# Patient Record
Sex: Male | Born: 1980 | Race: Black or African American | Hispanic: No | Marital: Married | Smoking: Current every day smoker
Health system: Southern US, Community
[De-identification: ages and names within clinical notes are randomized; demographics above are authoritative.]

## PROBLEM LIST (undated history)

## (undated) DIAGNOSIS — K859 Acute pancreatitis without necrosis or infection, unspecified: Secondary | ICD-10-CM

## (undated) HISTORY — PX: FACIAL RECONSTRUCTION SURGERY: SHX631

## (undated) HISTORY — PX: SHOULDER SURGERY: SHX246

---

## 1998-05-06 ENCOUNTER — Encounter: Payer: Self-pay | Admitting: Emergency Medicine

## 1998-05-06 ENCOUNTER — Inpatient Hospital Stay (HOSPITAL_COMMUNITY): Admission: EM | Admit: 1998-05-06 | Discharge: 1998-05-07 | Payer: Self-pay | Admitting: Emergency Medicine

## 2000-09-24 ENCOUNTER — Emergency Department (HOSPITAL_COMMUNITY): Admission: EM | Admit: 2000-09-24 | Discharge: 2000-09-24 | Payer: Self-pay | Admitting: Emergency Medicine

## 2001-06-01 ENCOUNTER — Emergency Department (HOSPITAL_COMMUNITY): Admission: EM | Admit: 2001-06-01 | Discharge: 2001-06-02 | Payer: Self-pay | Admitting: Emergency Medicine

## 2005-09-13 ENCOUNTER — Emergency Department (HOSPITAL_COMMUNITY): Admission: EM | Admit: 2005-09-13 | Discharge: 2005-09-13 | Payer: Self-pay | Admitting: Emergency Medicine

## 2006-07-19 ENCOUNTER — Emergency Department (HOSPITAL_COMMUNITY): Admission: EM | Admit: 2006-07-19 | Discharge: 2006-07-19 | Payer: Self-pay | Admitting: Family Medicine

## 2007-03-07 ENCOUNTER — Emergency Department (HOSPITAL_COMMUNITY): Admission: EM | Admit: 2007-03-07 | Discharge: 2007-03-07 | Payer: Self-pay | Admitting: Emergency Medicine

## 2008-03-16 ENCOUNTER — Emergency Department (HOSPITAL_COMMUNITY): Admission: EM | Admit: 2008-03-16 | Discharge: 2008-03-16 | Payer: Self-pay | Admitting: Family Medicine

## 2008-05-06 ENCOUNTER — Emergency Department (HOSPITAL_COMMUNITY): Admission: EM | Admit: 2008-05-06 | Discharge: 2008-05-06 | Payer: Self-pay | Admitting: Family Medicine

## 2008-07-29 ENCOUNTER — Emergency Department (HOSPITAL_COMMUNITY): Admission: EM | Admit: 2008-07-29 | Discharge: 2008-07-29 | Payer: Self-pay | Admitting: Emergency Medicine

## 2008-08-01 ENCOUNTER — Encounter: Admission: RE | Admit: 2008-08-01 | Discharge: 2008-09-11 | Payer: Self-pay | Admitting: Orthopedic Surgery

## 2008-10-12 ENCOUNTER — Emergency Department (HOSPITAL_COMMUNITY): Admission: EM | Admit: 2008-10-12 | Discharge: 2008-10-12 | Payer: Self-pay | Admitting: Emergency Medicine

## 2009-02-23 ENCOUNTER — Emergency Department (HOSPITAL_COMMUNITY): Admission: EM | Admit: 2009-02-23 | Discharge: 2009-02-23 | Payer: Self-pay | Admitting: Emergency Medicine

## 2011-04-09 LAB — POCT RAPID STREP A: Streptococcus, Group A Screen (Direct): NEGATIVE

## 2011-04-09 LAB — POCT URINALYSIS DIP (DEVICE)
Operator id: 235561
Urobilinogen, UA: 1
pH: 7.5

## 2011-07-23 ENCOUNTER — Encounter (HOSPITAL_COMMUNITY): Payer: Self-pay

## 2011-07-23 ENCOUNTER — Emergency Department (INDEPENDENT_AMBULATORY_CARE_PROVIDER_SITE_OTHER)
Admission: EM | Admit: 2011-07-23 | Discharge: 2011-07-23 | Disposition: A | Discharge status: Home / Self Care | Payer: Self-pay | Source: Home / Self Care | Attending: Emergency Medicine | Admitting: Emergency Medicine

## 2011-07-23 DIAGNOSIS — K089 Disorder of teeth and supporting structures, unspecified: Secondary | ICD-10-CM

## 2011-07-23 DIAGNOSIS — K0889 Other specified disorders of teeth and supporting structures: Secondary | ICD-10-CM

## 2011-07-23 MED ORDER — LIDOCAINE VISCOUS 2 % MT SOLN: 10.0000 mL | OROMUCOSAL | Status: AC | PRN

## 2011-07-23 MED ORDER — CHLORHEXIDINE GLUCONATE 0.12 % MT SOLN: OROMUCOSAL | Status: AC

## 2011-07-23 MED ORDER — HYDROCODONE-ACETAMINOPHEN 5-325 MG PO TABS: ORAL_TABLET | ORAL | Status: AC

## 2011-07-23 MED ORDER — PENICILLIN V POTASSIUM 500 MG PO TABS: 500.0000 mg | ORAL_TABLET | Freq: Four times a day (QID) | ORAL | Status: AC

## 2011-07-23 MED ORDER — IBUPROFEN 600 MG PO TABS: 600.0000 mg | ORAL_TABLET | Freq: Four times a day (QID) | ORAL | Status: AC | PRN
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
## 2011-07-23 NOTE — ED Notes (Addendum)
C/o bad tooth; has used vicodin, so not having pain at present. My woman ain't going to give me any more  of her medication any more, and I ain't any insurance to get any antibiotics

## 2011-07-23 NOTE — Discharge Instructions (Signed)
RESOURCE GUIDE  Dental Problems  Look up DebtSupply.pl.asp for a schedule of the  Dental Association's free dental clinics called [address] [facility] [place]. They have clinics all around [place]. Get there early and be prepared to wait.   [facility] [address] [place], [place] [phone]  Patients with Medicaid: [facility] [phone]                                [phone] [address] Phone:  [phone]                                                  Phone:  [phone]  If unable to pay or uninsured, contact:  [facility] or [facility]. to become qualified for the adult dental clinic.

## 2011-11-18 DIAGNOSIS — K089 Disorder of teeth and supporting structures, unspecified: Secondary | ICD-10-CM | POA: Insufficient documentation

## 2011-11-19 ENCOUNTER — Encounter (HOSPITAL_COMMUNITY): Payer: Self-pay

## 2011-11-19 ENCOUNTER — Emergency Department (HOSPITAL_COMMUNITY)
Admission: EM | Admit: 2011-11-19 | Discharge: 2011-11-19 | Disposition: A | Discharge status: Home / Self Care | Payer: Self-pay | Attending: Emergency Medicine | Admitting: Emergency Medicine

## 2011-11-19 DIAGNOSIS — K0889 Other specified disorders of teeth and supporting structures: Secondary | ICD-10-CM

## 2011-11-19 MED ORDER — ACETAMINOPHEN-CODEINE #3 300-30 MG PO TABS: 1.0000 | ORAL_TABLET | Freq: Four times a day (QID) | ORAL | Status: AC | PRN

## 2011-11-19 MED ORDER — ACETAMINOPHEN 325 MG PO TABS
650.0000 mg | ORAL_TABLET | Freq: Once | ORAL | Status: AC
  Administered 2011-11-19: 650 mg via ORAL
  Filled 2011-11-19: qty 1

## 2011-11-19 NOTE — ED Provider Notes (Signed)
Medical screening examination/treatment/procedure(s) were performed by non-physician practitioner and as supervising physician I was immediately available for consultation/collaboration.   Loren Racer, MD 11/19/11 [phone]
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
## 2011-11-19 NOTE — Discharge Instructions (Signed)
You were seen and evaluated for your dental pains. Please followup with the dentist for continued evaluation and treatment of your symptoms. Return to the hospital for any worsening symptoms, swelling of the canals, difficulty swallowing, difficulty breathing, fever, chills, sweats.   Dental Pain A tooth ache may be caused by cavities (tooth decay). Cavities expose the nerve of the tooth to air and hot or cold temperatures. It may come from an infection or abscess (also called a boil or furuncle) around your tooth. It is also often caused by dental caries (tooth decay). This causes the pain you are having. DIAGNOSIS  Your caregiver can diagnose this problem by exam. TREATMENT   If caused by an infection, it may be treated with medications which kill germs (antibiotics) and pain medications as prescribed by your caregiver. Take medications as directed.   Only take over-the-counter or prescription medicines for pain, discomfort, or fever as directed by your caregiver.   Whether the tooth ache today is caused by infection or dental disease, you should see your dentist as soon as possible for further care.  SEEK MEDICAL CARE IF: The exam and treatment you received today has been provided on an emergency basis only. This is not a substitute for complete medical or dental care. If your problem worsens or new problems (symptoms) appear, and you are unable to meet with your dentist, call or return to this location. SEEK IMMEDIATE MEDICAL CARE IF:   You have a fever.   You develop redness and swelling of your face, jaw, or neck.   You are unable to open your mouth.   You have severe pain uncontrolled by pain medicine.  MAKE SURE YOU:   Understand these instructions.   Will watch your condition.   Will get help right away if you are not doing well or get worse.  Document Released: 06/14/2005 Document Revised: 06/03/2011 Document Reviewed: 01/31/2008 [facility] Patient Information 2012  [place], [place].    RESOURCE GUIDE  Dental Problems  Patients with Medicaid: [facility] [phone]                                           [phone] [address] [employer] Phone:  [phone]                                                  Phone:  [phone]  If unable to pay or uninsured, contact:  [facility] or [facility]. to become qualified for the adult dental clinic.  Chronic Pain Problems Contact [facility]  [phone] Patients need to be referred by their primary care doctor.  Insufficient Money for Medicine Contact [employer]:  call "211" or [facility] [phone].  No Primary Care Doctor Call [facility]  [phone] Other agencies that provide inexpensive medical care    [facility]  [phone]    [facility]  [phone]    [facility]  [phone]    [facility]  [phone]    [facility]  [phone]    [facility]  [phone]  Psychological Services [facility] Behavior[facility]  [phone] [facility]  [phone] [facility]   [phone] (emergency services 5168773769)  Substance Abuse Resources [facility]  [phone] [facility] 6186515337 [facility] [place] [phone] Floydene Flock [phone] Residential & Outpatient Substance Abuse Program  [phone]  Abuse/Neglect [facility] Child Abuse Hotline 469-698-4157 [facility] Child Abuse Hotline [phone] [phone] (After Hours)  Emergency Shelter [facility] [phone]  Maternity Homes [facility] [place] [facility] [phone] [facility] [phone]  MRSA Hotline #:   719-001-4998    [facility] Resources  [facility]     [employer]                          [facility] Dept. [address] [place]                        [address]      [address] [place] [address]  Blondell Reveal Phone:  [phone]                                   Phone:  [phone]                 Phone:  [phone]  [facility] Phone:  [phone]  [facility] Child Abuse Hotline [phone] (After Hours)

## 2011-11-19 NOTE — ED Notes (Signed)
Pt complains of a toothache that started about three hours ago

## 2011-12-03 ENCOUNTER — Emergency Department (INDEPENDENT_AMBULATORY_CARE_PROVIDER_SITE_OTHER)
Admission: EM | Admit: 2011-12-03 | Discharge: 2011-12-03 | Disposition: A | Discharge status: Home / Self Care | Payer: Self-pay | Source: Home / Self Care

## 2011-12-03 ENCOUNTER — Encounter (HOSPITAL_COMMUNITY): Payer: Self-pay | Admitting: Emergency Medicine

## 2011-12-03 DIAGNOSIS — K121 Other forms of stomatitis: Secondary | ICD-10-CM

## 2011-12-03 MED ORDER — PENICILLIN V POTASSIUM 500 MG PO TABS: 500.0000 mg | ORAL_TABLET | Freq: Three times a day (TID) | ORAL | Status: AC

## 2011-12-03 NOTE — Discharge Instructions (Signed)
Patient was instructed to return to work tomorrow- 12/04/2011    Stomatitis Stomatitis is an inflammation of the mucous lining of the mouth. It can affect part of the mouth or the whole mouth. The intensity of symptoms can range from mild to severe. It can affect your cheek, teeth, gums, lips, or tongue. In almost all cases, the lining of the mouth becomes swollen, red, and painful. Painful ulcers can develop in your mouth. Stomatitis recurs in some people. CAUSES  There are many common causes of stomatitis. They include:  Viruses (such as cold sores or shingles).   Canker sores.   Bacteria (such as ulcerative gingivitis or sexually transmitted diseases).   Fungus or yeast (such as candidiasis or oral thrush).   Poor oral hygiene and poor nutrition (Vincent's stomatitis or trench mouth).   Lack of vitamin B, vitamin C, or niacin.   Dentures or braces that do not fit properly.   High acid foods (uncommon).   Sharp or broken teeth.   Cheek biting.   Breathing through the mouth.   Chewing tobacco.   Allergy to toothpaste, mouthwash, candy, gum, lipstick, or some medicines.   Burning your mouth with hot drinks or food.   Exposure to dyes, heavy metals, acid fumes, or mineral dust.  SYMPTOMS   Painful ulcers in the mouth.   Blisters in the mouth.   Bleeding gums.   Swollen gums.   Irritability.   Bad breath.   Bad taste in the mouth.   Fever.   Trouble eating because of burning and pain in the mouth.  DIAGNOSIS  Your caregiver will examine your mouth and look for bleeding gums and mouth ulcers. Your caregiver may ask you about the medicines you are taking. Your caregiver may suggest a blood test and tissue sample (biopsy) of the mouth ulcer or mass if either is present. This will help find the cause of your condition. TREATMENT  Your treatment will depend on the cause of your condition. Your caregiver will first try to treat your symptoms.   You may be  given pain medicine. Topical anesthetic may be used to numb the area if you have severe pain.   Your caregiver may prescribe antibiotic medicine if you have a bacterial infection.   Your caregiver may prescribe antifungal medicine if you have a fungal infection.   You may need to take antiviral medicine if you have a viral infection like herpes.   You may be asked to use medicated mouth rinses.   Your caregiver will advise you about proper brushing and using a soft toothbrush. You also need to get your teeth cleaned regularly.  HOME CARE INSTRUCTIONS   Maintain good oral hygiene. This is especially important for transplant patients.   Brush your teeth carefully with a soft, nylon-bristled toothbrush.   Floss at least 2 times a day.   Clean your mouth after eating.   Rinse your mouth with salt water 3 to 4 times a day.   Gargle with cold water.   Use topical numbing medicines to decrease pain if recommended by your caregiver.   Stop smoking, and stop using chewing or smokeless tobacco.   Avoid eating hot and spicy foods.   Eat soft and bland food.   Reduce your stress wherever possible.   Eat healthy and nutritious foods.  SEEK MEDICAL CARE IF:   Your symptoms persist or get worse.   You develop new symptoms.   Your mouth ulcers are present for more  than 3 weeks.   Your mouth ulcers come back frequently.   You have increasing difficulty with normal eating and drinking.   You have increasing fatigue or weakness.   You develop loss of appetite or nausea.  SEEK IMMEDIATE MEDICAL CARE IF:   You have a fever.   You develop pain, redness, or sores around one or both eyes.   You cannot eat or drink because of pain or other symptoms.   You develop worsening weakness, or you faint.   You develop vomiting or diarrhea.   You develop chest pain, shortness of breath, or rapid and irregular heartbeats.  MAKE SURE YOU:  Understand these instructions.   Will watch  your condition.   Will get help right away if you are not doing well or get worse.  Document Released: 04/11/2007 Document Revised: 06/03/2011 Document Reviewed: 01/21/2011 [facility] Patient Information 2012 [place], [place].
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
## 2011-12-03 NOTE — ED Notes (Signed)
Pt here with c/o left uppper tooth abscess and pain that flared up Tuesday.denies drainage but sore to the touch.pt was seen here 11/19/11 for same sx.no dental f/u reported.vss.pt took percocet and aleve for pain

## 2012-01-07 ENCOUNTER — Encounter (HOSPITAL_COMMUNITY): Payer: Self-pay | Admitting: Emergency Medicine

## 2012-01-07 ENCOUNTER — Emergency Department (HOSPITAL_COMMUNITY)
Admission: EM | Admit: 2012-01-07 | Discharge: 2012-01-07 | Disposition: A | Discharge status: Home / Self Care | Payer: Self-pay | Attending: Emergency Medicine | Admitting: Emergency Medicine

## 2012-01-07 ENCOUNTER — Emergency Department (HOSPITAL_COMMUNITY): Payer: Self-pay

## 2012-01-07 DIAGNOSIS — R51 Headache: Secondary | ICD-10-CM | POA: Insufficient documentation

## 2012-01-07 DIAGNOSIS — F17200 Nicotine dependence, unspecified, uncomplicated: Secondary | ICD-10-CM | POA: Insufficient documentation

## 2012-01-07 DIAGNOSIS — K089 Disorder of teeth and supporting structures, unspecified: Secondary | ICD-10-CM | POA: Insufficient documentation

## 2012-01-07 DIAGNOSIS — K0889 Other specified disorders of teeth and supporting structures: Secondary | ICD-10-CM

## 2012-01-07 MED ORDER — KETOROLAC TROMETHAMINE 60 MG/2ML IM SOLN
60.0000 mg | Freq: Once | INTRAMUSCULAR | Status: AC
  Administered 2012-01-07: 60 mg via INTRAMUSCULAR
  Filled 2012-01-07: qty 2
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
## 2012-01-08 NOTE — ED Provider Notes (Signed)
Medical screening examination/treatment/procedure(s) were performed by non-physician practitioner and as supervising physician I was immediately available for consultation/collaboration.  Jacorion Klem, MD 01/08/12 2101 

## 2012-02-14 ENCOUNTER — Encounter (HOSPITAL_COMMUNITY): Payer: Self-pay | Admitting: *Deleted

## 2012-02-14 ENCOUNTER — Emergency Department (HOSPITAL_COMMUNITY)
Admission: EM | Admit: 2012-02-14 | Discharge: 2012-02-14 | Disposition: A | Discharge status: Home / Self Care | Payer: Self-pay | Attending: Emergency Medicine | Admitting: Emergency Medicine

## 2012-02-14 DIAGNOSIS — F17200 Nicotine dependence, unspecified, uncomplicated: Secondary | ICD-10-CM | POA: Insufficient documentation

## 2012-02-14 DIAGNOSIS — K859 Acute pancreatitis without necrosis or infection, unspecified: Secondary | ICD-10-CM | POA: Insufficient documentation

## 2012-02-14 DIAGNOSIS — K0889 Other specified disorders of teeth and supporting structures: Secondary | ICD-10-CM

## 2012-02-14 DIAGNOSIS — K029 Dental caries, unspecified: Secondary | ICD-10-CM | POA: Insufficient documentation

## 2012-02-14 HISTORY — DX: Acute pancreatitis without necrosis or infection, unspecified: K85.90

## 2012-02-14 MED ORDER — PENICILLIN V POTASSIUM 500 MG PO TABS: 500.0000 mg | ORAL_TABLET | Freq: Three times a day (TID) | ORAL | Status: AC

## 2012-02-14 MED ORDER — TRAMADOL HCL 50 MG PO TABS: 50.0000 mg | ORAL_TABLET | Freq: Four times a day (QID) | ORAL | Status: AC | PRN
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
## 2012-02-14 NOTE — ED Notes (Signed)
Pt c/o right upper jaw toothache x 2 days; states popped a "bubble" back there and it feels worse since then

## 2012-02-14 NOTE — ED Notes (Signed)
Pt sts pain x 1 week on the right side of his mouth. Pt sts he burst a bubble in the back right upper portion of his mouth yesterday and tastes something coming out of it. Pain has gotten worse since yesterday. Sts he has not been able to eat like normal and feels the pain shooting up his head when he lays down.

## 2012-02-14 NOTE — ED Notes (Signed)
Patient given discharge instructions, information, prescriptions, and diet order. Patient states that they adequately understand discharge information given and to return to ED if symptoms return or worsen.     

## 2012-02-14 NOTE — ED Provider Notes (Signed)
Medical screening examination/treatment/procedure(s) were performed by non-physician practitioner and as supervising physician I was immediately available for consultation/collaboration.  Olivia Mackie, MD 02/14/12 [phone]

## 2012-11-28 IMAGING — CT CT HEAD W/O CM
2 series · 17 of 30 positions shown, 20 images · non-contrast
Comparison: None.

CLINICAL DATA: Left-sided headache and facial pain.

CT HEAD WITHOUT CONTRAST
TECHNIQUE: Contiguous axial images were obtained from the base of
the skull through the vertex without contrast.

[Series 2: head w/o · axial · non-contrast · 0.43mm/px · z∈[+1421,+1536]mm · 9 of 29 slices shown, 12 images]
[im 3/29  brain]
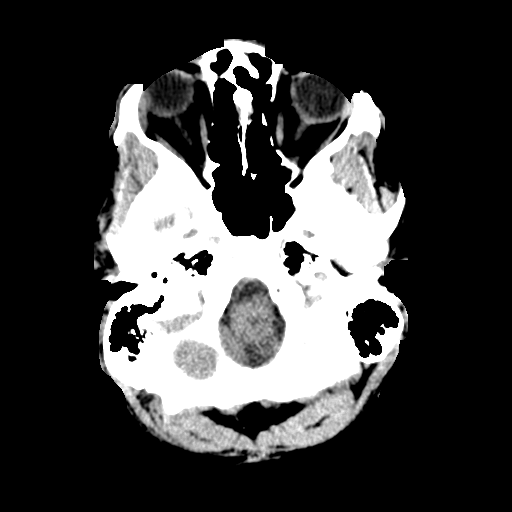
[im 3/29  bone]
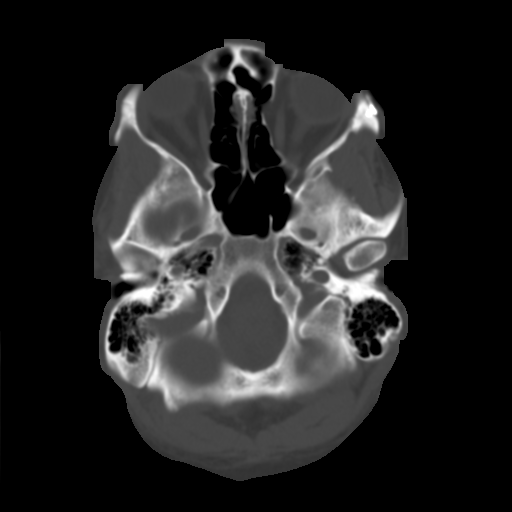
[im 6/29  brain]
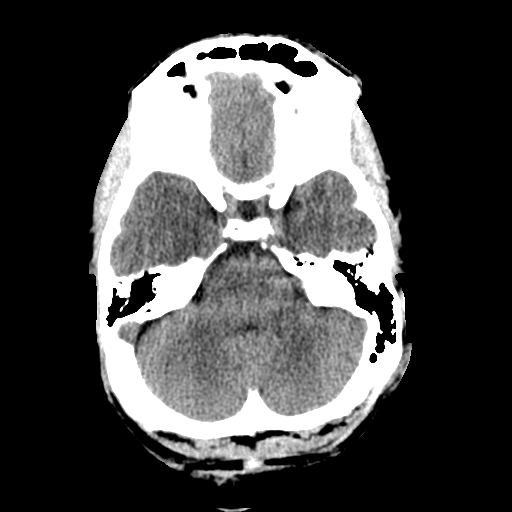
[im 9/29  brain]
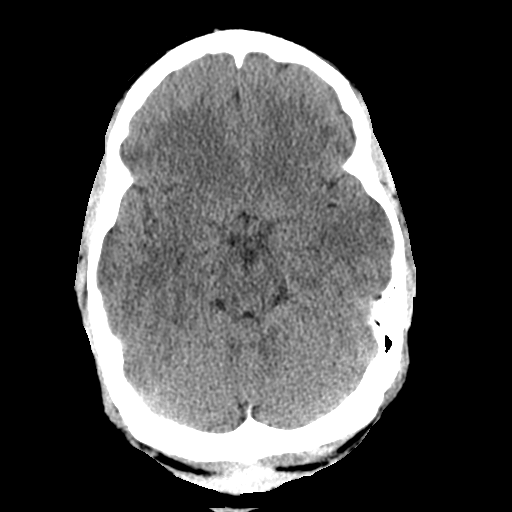
[im 12/29  brain]
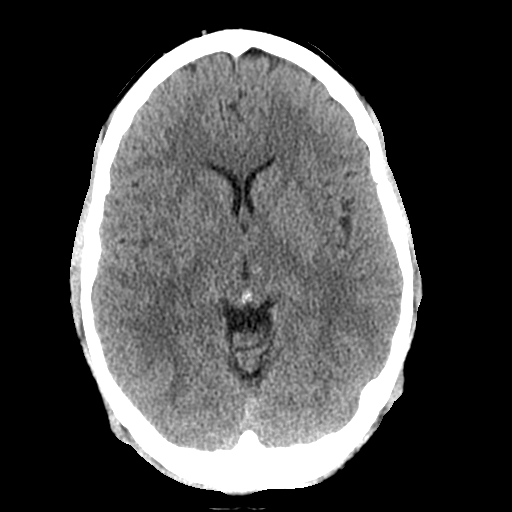
[im 15/29  brain]
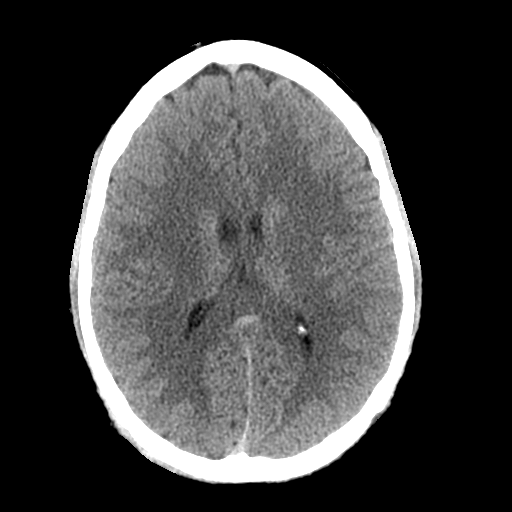
[im 15/29  bone]
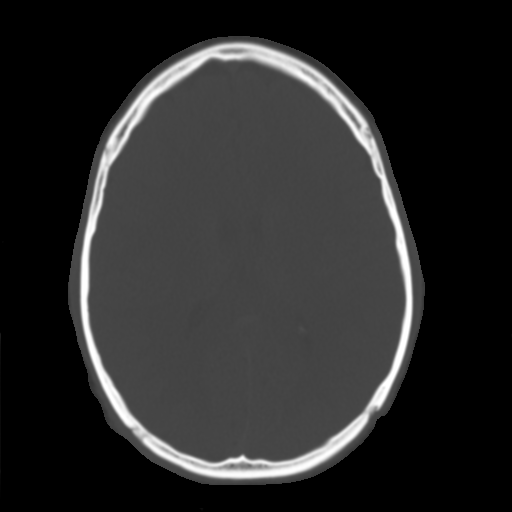
[im 17/29  brain]
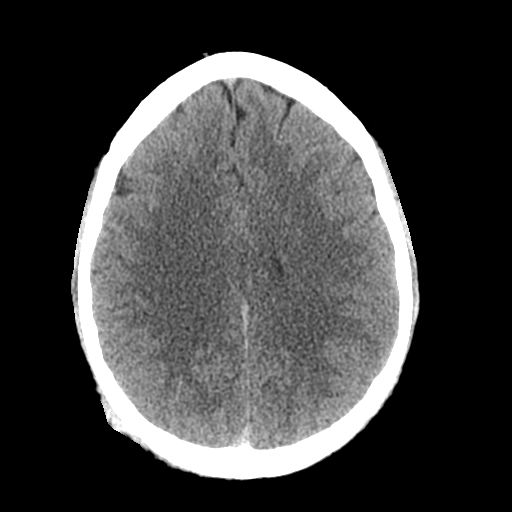
[im 20/29  brain]
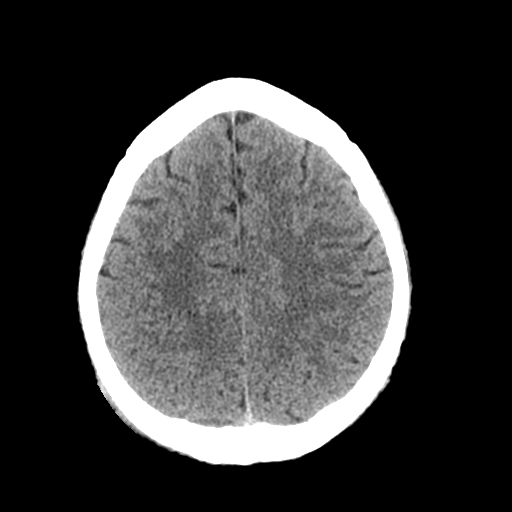
[im 23/29  brain]
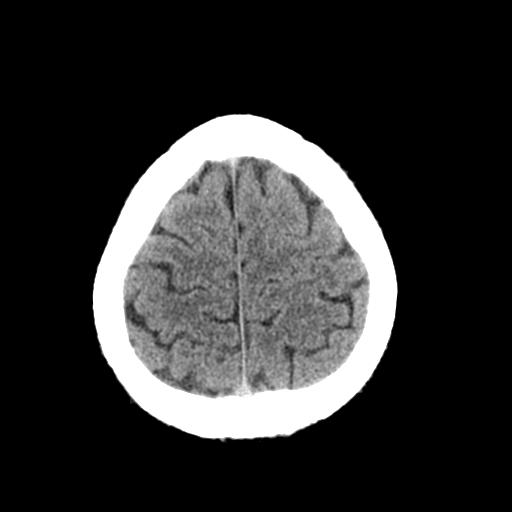
[im 26/29  brain]
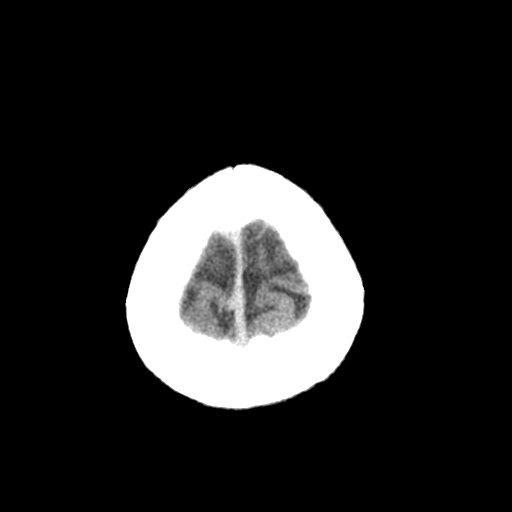
[im 26/29  bone]
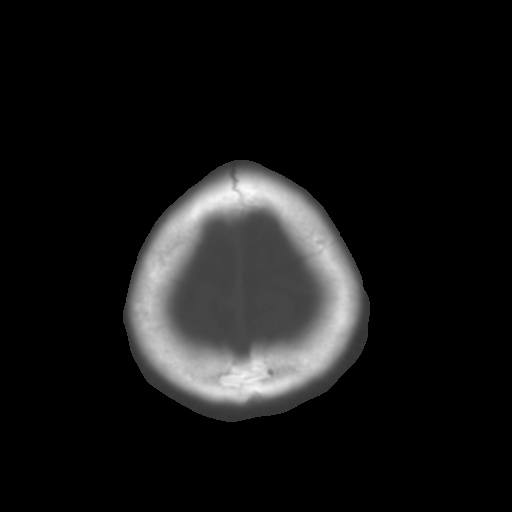

[Series 3: bone windows · axial · 0.43mm/px · z∈[+1426,+1534]mm · 8 of 48 slices shown]
[im 6/48  bone]
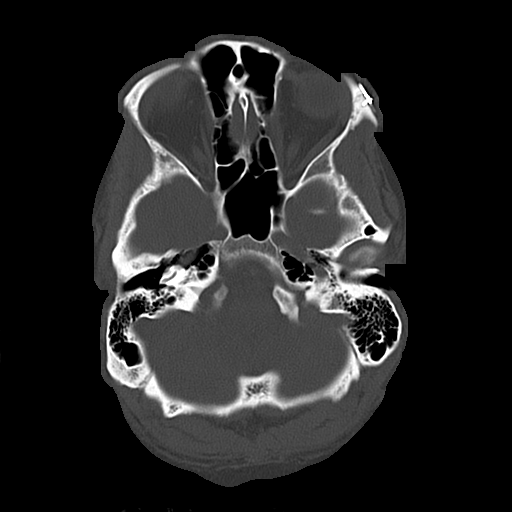
[im 11/48  bone]
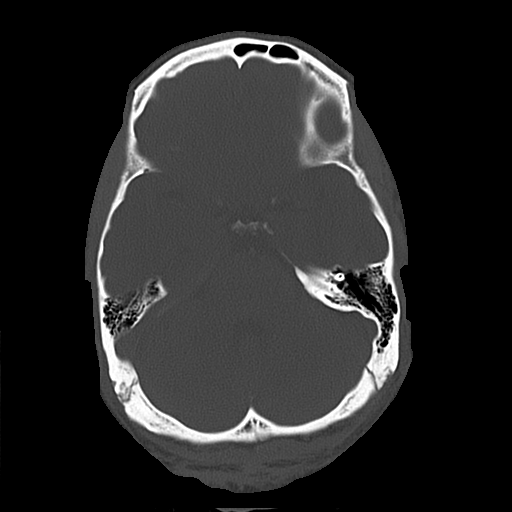
[im 16/48  bone]
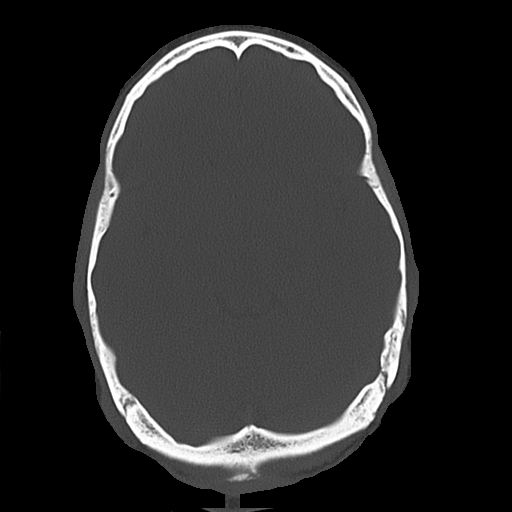
[im 21/48  bone]
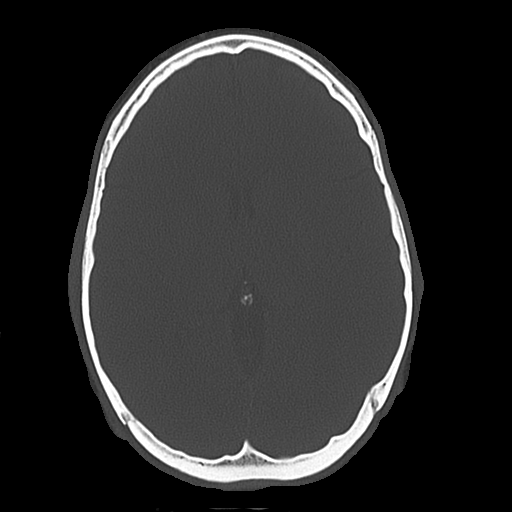
[im 27/48  bone]
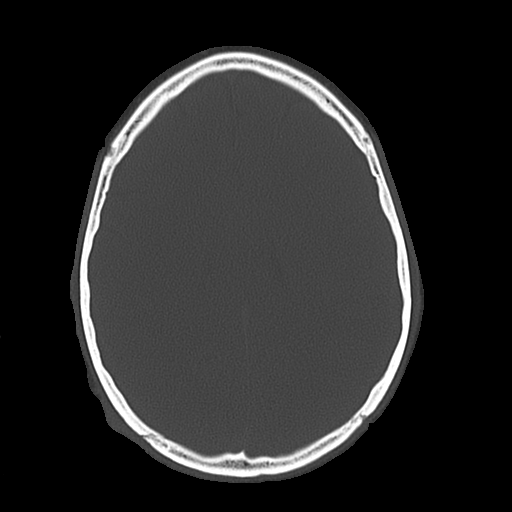
[im 32/48  bone]
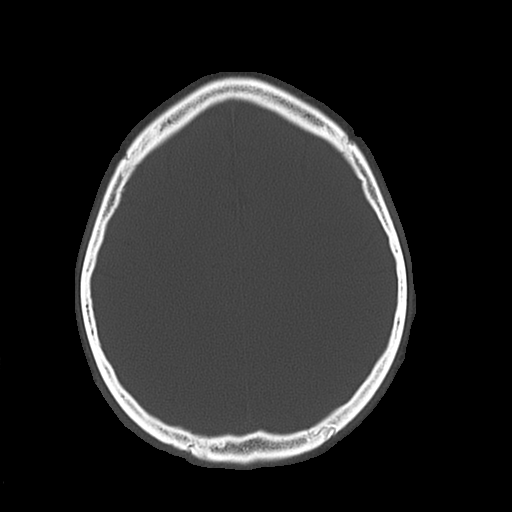
[im 37/48  bone]
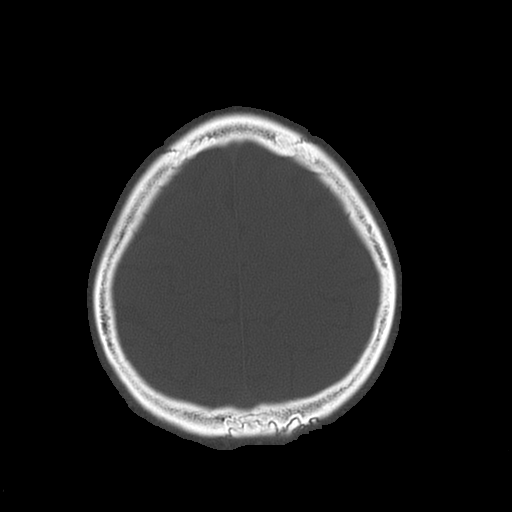
[im 42/48  bone]
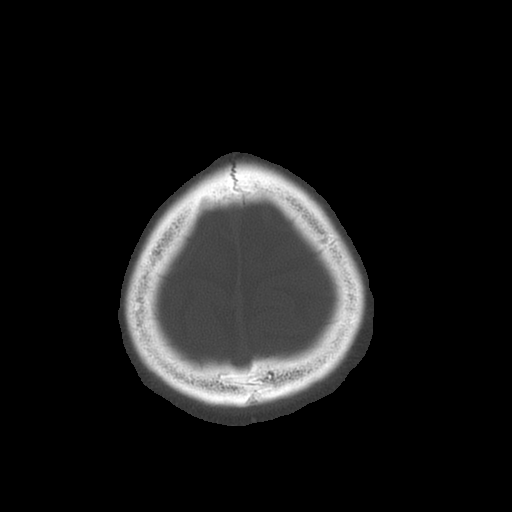

[17 of 30 positions shown; findings below may reference images not displayed]

FINDINGS: There is no evidence of intracranial hemorrhage, brain
edema or other signs of acute infarction.  There is no evidence of
intracranial mass lesion or mass effect.  No abnormal extra-axial
fluid collections are identified.

Ventricles are normal in size.  No evidence of skull fracture.
Fixation plate and screws are seen across the left frontal
zygomatic suture.
IMPRESSION: No evidence of intracranial abnormality.

## 2014-12-20 ENCOUNTER — Encounter (HOSPITAL_COMMUNITY): Payer: Self-pay | Admitting: Emergency Medicine

## 2014-12-20 ENCOUNTER — Emergency Department (HOSPITAL_COMMUNITY)
Admission: EM | Admit: 2014-12-20 | Discharge: 2014-12-20 | Disposition: A | Discharge status: Home / Self Care | Payer: Self-pay | Attending: Emergency Medicine | Admitting: Emergency Medicine

## 2014-12-20 DIAGNOSIS — Z8719 Personal history of other diseases of the digestive system: Secondary | ICD-10-CM | POA: Insufficient documentation

## 2014-12-20 DIAGNOSIS — Z79899 Other long term (current) drug therapy: Secondary | ICD-10-CM | POA: Insufficient documentation

## 2014-12-20 DIAGNOSIS — R112 Nausea with vomiting, unspecified: Secondary | ICD-10-CM | POA: Insufficient documentation

## 2014-12-20 DIAGNOSIS — R1012 Left upper quadrant pain: Secondary | ICD-10-CM | POA: Insufficient documentation

## 2014-12-20 DIAGNOSIS — Z720 Tobacco use: Secondary | ICD-10-CM | POA: Insufficient documentation

## 2014-12-20 DIAGNOSIS — K5900 Constipation, unspecified: Secondary | ICD-10-CM | POA: Insufficient documentation

## 2014-12-20 LAB — COMPREHENSIVE METABOLIC PANEL
ALBUMIN: 4.5 g/dL (ref 3.5–5.0)
ALK PHOS: 29 U/L — AB (ref 38–126)
ALT: 11 U/L — AB (ref 17–63)
AST: 19 U/L (ref 15–41)
Anion gap: 8 (ref 5–15)
BILIRUBIN TOTAL: 0.5 mg/dL (ref 0.3–1.2)
BUN: 9 mg/dL (ref 6–20)
CHLORIDE: 103 mmol/L (ref 101–111)
CO2: 29 mmol/L (ref 22–32)
CREATININE: 0.88 mg/dL (ref 0.61–1.24)
Calcium: 9.4 mg/dL (ref 8.9–10.3)
Glucose, Bld: 119 mg/dL — ABNORMAL HIGH (ref 65–99)
POTASSIUM: 4.5 mmol/L (ref 3.5–5.1)
SODIUM: 140 mmol/L (ref 135–145)
Total Protein: 7.8 g/dL (ref 6.5–8.1)

## 2014-12-20 LAB — URINALYSIS, ROUTINE W REFLEX MICROSCOPIC
Bilirubin Urine: NEGATIVE
GLUCOSE, UA: NEGATIVE mg/dL
HGB URINE DIPSTICK: NEGATIVE
KETONES UR: NEGATIVE mg/dL
Leukocytes, UA: NEGATIVE
Nitrite: NEGATIVE
PROTEIN: NEGATIVE mg/dL
Specific Gravity, Urine: 1.016 (ref 1.005–1.030)
Urobilinogen, UA: 1 mg/dL (ref 0.0–1.0)
pH: 8 (ref 5.0–8.0)

## 2014-12-20 LAB — CBC WITH DIFFERENTIAL/PLATELET
BASOS ABS: 0 10*3/uL (ref 0.0–0.1)
BASOS PCT: 0 % (ref 0–1)
EOS ABS: 0.1 10*3/uL (ref 0.0–0.7)
EOS PCT: 1 % (ref 0–5)
HEMATOCRIT: 40.8 % (ref 39.0–52.0)
Hemoglobin: 13.2 g/dL (ref 13.0–17.0)
Lymphocytes Relative: 11 % — ABNORMAL LOW (ref 12–46)
Lymphs Abs: 0.8 10*3/uL (ref 0.7–4.0)
MCH: 29.3 pg (ref 26.0–34.0)
MCHC: 32.4 g/dL (ref 30.0–36.0)
MCV: 90.5 fL (ref 78.0–100.0)
MONO ABS: 0.2 10*3/uL (ref 0.1–1.0)
Monocytes Relative: 3 % (ref 3–12)
NEUTROS ABS: 6.3 10*3/uL (ref 1.7–7.7)
NEUTROS PCT: 85 % — AB (ref 43–77)
Platelets: 245 10*3/uL (ref 150–400)
RBC: 4.51 MIL/uL (ref 4.22–5.81)
RDW: 12.6 % (ref 11.5–15.5)
WBC: 7.4 10*3/uL (ref 4.0–10.5)

## 2014-12-20 LAB — LIPASE, BLOOD: Lipase: 18 U/L — ABNORMAL LOW (ref 22–51)

## 2014-12-20 MED ORDER — AMOXICILLIN 500 MG PO CAPS: 1000.0000 mg | ORAL_CAPSULE | Freq: Two times a day (BID) | ORAL | Status: DC

## 2014-12-20 MED ORDER — OMEPRAZOLE 20 MG PO CPDR: 20.0000 mg | DELAYED_RELEASE_CAPSULE | Freq: Two times a day (BID) | ORAL | Status: AC

## 2014-12-20 MED ORDER — GI COCKTAIL ~~LOC~~
30.0000 mL | Freq: Once | ORAL | Status: AC
  Administered 2014-12-20: 30 mL via ORAL
  Filled 2014-12-20: qty 30

## 2014-12-20 MED ORDER — CLARITHROMYCIN 500 MG PO TABS: 500.0000 mg | ORAL_TABLET | Freq: Two times a day (BID) | ORAL | Status: DC

## 2014-12-20 NOTE — Discharge Instructions (Signed)

## 2014-12-20 NOTE — ED Notes (Signed)
Pt w/ hx of pancreatitis.  C/o mid abd pain radiating to back since last night.  States one episode of vomiting.  Denies diarrhea.

## 2014-12-20 NOTE — ED Notes (Signed)
Nurse drawing labs. 

## 2014-12-20 NOTE — Progress Notes (Signed)
CM spoke with pt who confirms self pay [place] resident with no pcp.  CM discussed and provided written information for self pay pcps, discussed the importance of pcp vs EDP services for f/u care, www.needymeds.org, www.goodrx.com, discounted pharmacies and other [place] resources such as [facility] , [facility], affordable care act,  [place] med assist, financial assistance, self pay dental services, [place] med assist, DSS and  health department  Reviewed resources for [place] self pay pcps like [facility], [facility] [address], [facility], [facility], local urgent care centers, [facility], [facility], general [facility] clinics, [facility], [facility] plus others, medication resources, [facility] out patient pharmacies and housing Pt voiced understanding and appreciation of resources provided   Provided [facility] contact information Pt agreed to a referral Cm completed referral Pt to be contact by [facility] clinical liason 

## 2014-12-20 NOTE — ED Notes (Signed)
Questions r/t dc denied. Pt ambulatory and a&ox4 
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
## 2020-11-25 ENCOUNTER — Encounter: Payer: Self-pay | Admitting: Emergency Medicine

## 2020-11-25 ENCOUNTER — Ambulatory Visit
Admission: EM | Admit: 2020-11-25 | Discharge: 2020-11-25 | Disposition: A | Discharge status: Home / Self Care | Payer: 59 | Attending: Family Medicine | Admitting: Family Medicine

## 2020-11-25 DIAGNOSIS — R52 Pain, unspecified: Secondary | ICD-10-CM

## 2020-11-25 DIAGNOSIS — R5383 Other fatigue: Secondary | ICD-10-CM | POA: Diagnosis not present

## 2020-11-25 DIAGNOSIS — W57XXXA Bitten or stung by nonvenomous insect and other nonvenomous arthropods, initial encounter: Secondary | ICD-10-CM

## 2020-11-25 DIAGNOSIS — S30861A Insect bite (nonvenomous) of abdominal wall, initial encounter: Secondary | ICD-10-CM

## 2020-11-25 MED ORDER — DOXYCYCLINE HYCLATE 100 MG PO CAPS: 100.0000 mg | ORAL_CAPSULE | Freq: Two times a day (BID) | ORAL | 0 refills | Status: DC

## 2020-11-25 MED ORDER — DOXYCYCLINE HYCLATE 100 MG PO TABS
100.0000 mg | ORAL_TABLET | Freq: Once | ORAL | Status: AC
  Administered 2020-11-25: 100 mg via ORAL

## 2020-11-25 NOTE — Discharge Instructions (Addendum)
I have sent in doxycycline for you to take one tablet twice a day for 10 days  We are checking labs as well  Your COVID and Influenza tests are pending.  You should self quarantine until the test results are back.    Take Tylenol or ibuprofen as needed for fever or discomfort.  Rest and keep yourself hydrated.    Follow-up with your primary care provider if your symptoms are not improving.

## 2020-11-25 NOTE — ED Triage Notes (Signed)
Tick bite on Wednesday  to lower RT back.  Has been sick with flu like s/s since Saturday.

## 2020-11-27 LAB — COVID-19, FLU A+B NAA
Influenza A, NAA: NOT DETECTED
Influenza B, NAA: NOT DETECTED
SARS-CoV-2, NAA: NOT DETECTED

## 2020-11-27 LAB — LYME DISEASE SEROLOGY W/REFLEX: Lyme Total Antibody EIA: NEGATIVE
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
## 2020-12-19 LAB — CBC WITH DIFFERENTIAL/PLATELET
Basophils Absolute: 0 10*3/uL (ref 0.0–0.2)
Basos: 0 %
EOS (ABSOLUTE): 0.1 10*3/uL (ref 0.0–0.4)
Eos: 2 %
Hematocrit: 43.1 % (ref 37.5–51.0)
Hemoglobin: 14.3 g/dL (ref 13.0–17.7)
Immature Grans (Abs): 0 10*3/uL (ref 0.0–0.1)
Immature Granulocytes: 0 %
Lymphocytes Absolute: 1.6 10*3/uL (ref 0.7–3.1)
Lymphs: 24 %
MCH: 30.8 pg (ref 26.6–33.0)
MCHC: 33.2 g/dL (ref 31.5–35.7)
MCV: 93 fL (ref 79–97)
Monocytes Absolute: 0.4 10*3/uL (ref 0.1–0.9)
Monocytes: 6 %
Neutrophils Absolute: 4.4 10*3/uL (ref 1.4–7.0)
Neutrophils: 68 %
Platelets: 289 10*3/uL (ref 150–450)
RBC: 4.64 x10E6/uL (ref 4.14–5.80)
RDW: 11.8 % (ref 11.6–15.4)
WBC: 6.6 10*3/uL (ref 3.4–10.8)

## 2020-12-19 LAB — SPECIMEN STATUS REPORT

## 2020-12-23 ENCOUNTER — Encounter: Payer: Self-pay | Admitting: Emergency Medicine

## 2020-12-23 ENCOUNTER — Other Ambulatory Visit: Payer: Self-pay

## 2020-12-23 ENCOUNTER — Ambulatory Visit
Admission: EM | Admit: 2020-12-23 | Discharge: 2020-12-23 | Disposition: A | Discharge status: Home / Self Care | Payer: 59 | Attending: Physician Assistant | Admitting: Physician Assistant

## 2020-12-23 DIAGNOSIS — K047 Periapical abscess without sinus: Secondary | ICD-10-CM

## 2020-12-23 MED ORDER — CLINDAMYCIN HCL 300 MG PO CAPS: 300.0000 mg | ORAL_CAPSULE | Freq: Three times a day (TID) | ORAL | 0 refills | Status: AC

## 2020-12-23 NOTE — ED Triage Notes (Signed)
Dental pain to both sides. Pt has hx of dental abscess.
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
## 2023-02-27 ENCOUNTER — Emergency Department (HOSPITAL_COMMUNITY)
Admission: EM | Admit: 2023-02-27 | Discharge: 2023-02-27 | Disposition: A | Discharge status: Home / Self Care | Payer: Commercial Managed Care - HMO | Attending: Emergency Medicine | Admitting: Emergency Medicine

## 2023-02-27 ENCOUNTER — Encounter (HOSPITAL_COMMUNITY): Payer: Self-pay | Admitting: *Deleted

## 2023-02-27 ENCOUNTER — Other Ambulatory Visit: Payer: Self-pay

## 2023-02-27 DIAGNOSIS — T162XXA Foreign body in left ear, initial encounter: Secondary | ICD-10-CM | POA: Diagnosis present

## 2023-02-27 DIAGNOSIS — W44F4XA Insect entering into or through a natural orifice, initial encounter: Secondary | ICD-10-CM | POA: Insufficient documentation

## 2023-02-27 MED ORDER — LIDOCAINE VISCOUS HCL 2 % MT SOLN
15.0000 mL | Freq: Once | OROMUCOSAL | Status: AC
  Administered 2023-02-27: 15 mL via TOPICAL
  Filled 2023-02-27: qty 15

## 2023-02-27 NOTE — ED Notes (Signed)
Introduced self to pt Pt stated that he thinks a cockroach crawled in LEFT ear last night Pt stated that he caught a leg but feels and hears the live bug in ear  Attached to partial monitor Vitals listed  Pt laying in LEFT side, medication placed in RIGHT ear Pt aware to keep head in current position Will reeval

## 2023-02-27 NOTE — ED Notes (Signed)
Pt resting comfortably on stretcher  All supplies for lavage at bedside for PA

## 2023-02-27 NOTE — ED Provider Notes (Signed)
  [place] [facility] AT [facility] Provider Note   CSN: [id number] Arrival date & time: 02/27/23  1236     History  Chief Complaint  Patient presents with   Foreign Body in Ear    Christopher Chavez is a 42 y.o. male.  Patient here with complaint of a bug in the right ear since this morning. He can feel it moving evan after attempting to flush it out at home. No hearing loss. No bleeding.   The history is provided by the patient. No language interpreter was used.  Foreign Body in Ear       Home Medications Prior to Admission medications   Medication Sig Start Date End Date Taking? Authorizing Provider  Buprenorphine HCl-Naloxone HCl (SUBOXONE) 8-2 MG FILM Place 0.5 Film under the tongue 2 (two) times daily.    [provider]  doxycycline (VIBRAMYCIN) 100 MG capsule Take 1 capsule (100 mg total) by mouth 2 (two) times daily. 11/25/20   Moshe Cipro, FNP  omeprazole (PRILOSEC) 20 MG capsule Take 1 capsule (20 mg total) by mouth 2 (two) times daily before a meal. 12/20/14   Mirian Mo, MD      Allergies    Morphine and codeine    Review of Systems   Review of Systems  Physical Exam Updated Vital Signs BP (!) 129/91   Pulse (!) 52   Temp (!) 97.3 F (36.3 C)   Resp 16   Ht 6\' 1"  (1.854 m)   Wt 108.4 kg   SpO2 100%   BMI 31.53 kg/m  Physical Exam Vitals and nursing note reviewed.  HENT:     Ears:     Comments: FB in the right ear canal. No movement noticed. No bleeding or canal swelling.     ED Results / Procedures / Treatments   Labs (all labs ordered are listed, but only abnormal results are displayed) Labs Reviewed - No data to display  EKG None  Radiology No results found.  Procedures Procedures    Medications Ordered in ED Medications  lidocaine (XYLOCAINE) 2 % viscous mouth solution 15 mL (15 mLs Topical Given 02/27/23 1430)    ED Course/ Medical Decision Making/ A&P Clinical Course as of  02/27/23 1608  Sun Feb 27, 2023  1426 Difficult to visualize position of the bug in the canal. Viscous lidocaine ordered. Will attempt to extract FB with alligator forceps/  [SU]  1601 Ear canal irrigated. Ear wax evacuated but insect remains deep in canal. Alligator forceps without success. Will refer to ENT for outpatient treatment.  [SU]    Clinical Course User Index [SU] Elpidio Anis, PA-C                                 Medical Decision Making Risk Prescription drug management.           Final Clinical Impression(s) / ED Diagnoses Final diagnoses:  Foreign body of left ear, initial encounter    Rx / DC Orders ED Discharge Orders     None         Elpidio Anis, PA-C 02/27/23 1608    Bethann Berkshire, MD 03/01/23 1513

## 2023-02-27 NOTE — ED Triage Notes (Signed)
Pt states he has a  bug in right ear this morning. Pt has tried to flush out ear at home but without success.

## 2023-02-27 NOTE — Discharge Instructions (Addendum)
Call Dr. Lucky Rathke office Tuesday after the holiday to schedule a time to be seen in office for further treatment.

## 2023-10-05 ENCOUNTER — Ambulatory Visit (HOSPITAL_COMMUNITY)
Admission: EM | Admit: 2023-10-05 | Discharge: 2023-10-05 | Disposition: A | Discharge status: Home / Self Care | Attending: Emergency Medicine | Admitting: Emergency Medicine

## 2023-10-05 ENCOUNTER — Encounter (HOSPITAL_COMMUNITY): Payer: Self-pay

## 2023-10-05 DIAGNOSIS — J019 Acute sinusitis, unspecified: Secondary | ICD-10-CM | POA: Diagnosis not present

## 2023-10-05 MED ORDER — FLUTICASONE PROPIONATE 50 MCG/ACT NA SUSP: 1.0000 | Freq: Every day | NASAL | 2 refills | Status: AC

## 2023-10-05 MED ORDER — AMOXICILLIN-POT CLAVULANATE 875-125 MG PO TABS: 1.0000 | ORAL_TABLET | Freq: Two times a day (BID) | ORAL | 0 refills | Status: AC
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
## 2023-10-05 NOTE — Discharge Instructions (Addendum)
 Take the Augmentin twice daily until finished.  Take it with food to prevent stomach upset.  Continue taking your Claritin, you can add on the Flonase nasal spray to help with any allergic symptoms.  100 mg of ibuprofen every 8 hours can help with pain and inflammation.  Sleep with a humidifier may help loosen up any secretions.  Symptoms should improve over the next few days with the antibiotics, if no improvement or any changes please return to clinic for reevaluation.
